# Patient Record
Sex: Male | Born: 1975 | Race: Black or African American | Hispanic: No | Marital: Married | Smoking: Never smoker
Health system: Southern US, Community
[De-identification: ages and names within clinical notes are randomized; demographics above are authoritative.]

## PROBLEM LIST (undated history)

## (undated) DIAGNOSIS — I10 Essential (primary) hypertension: Secondary | ICD-10-CM

---

## 2002-12-22 ENCOUNTER — Encounter: Payer: Self-pay | Admitting: Family Medicine

## 2002-12-22 ENCOUNTER — Ambulatory Visit (HOSPITAL_COMMUNITY): Admission: RE | Admit: 2002-12-22 | Discharge: 2002-12-22 | Payer: Self-pay | Admitting: Family Medicine

## 2005-10-30 ENCOUNTER — Ambulatory Visit: Payer: Self-pay | Admitting: Orthopedic Surgery

## 2006-08-21 ENCOUNTER — Emergency Department (HOSPITAL_COMMUNITY): Admission: EM | Admit: 2006-08-21 | Discharge: 2006-08-22 | Payer: Self-pay | Admitting: Emergency Medicine

## 2007-10-23 IMAGING — CR DG SHOULDER 2+V PORT*R*
1 series · 1 of 1 positions shown · non-contrast
Comparison: 08/21/06.

CLINICAL DATA: Shoulder dislocation. Status-post reduction. 
 RIGHT SHOULDER ? 1 VIEW:

[view not recorded]
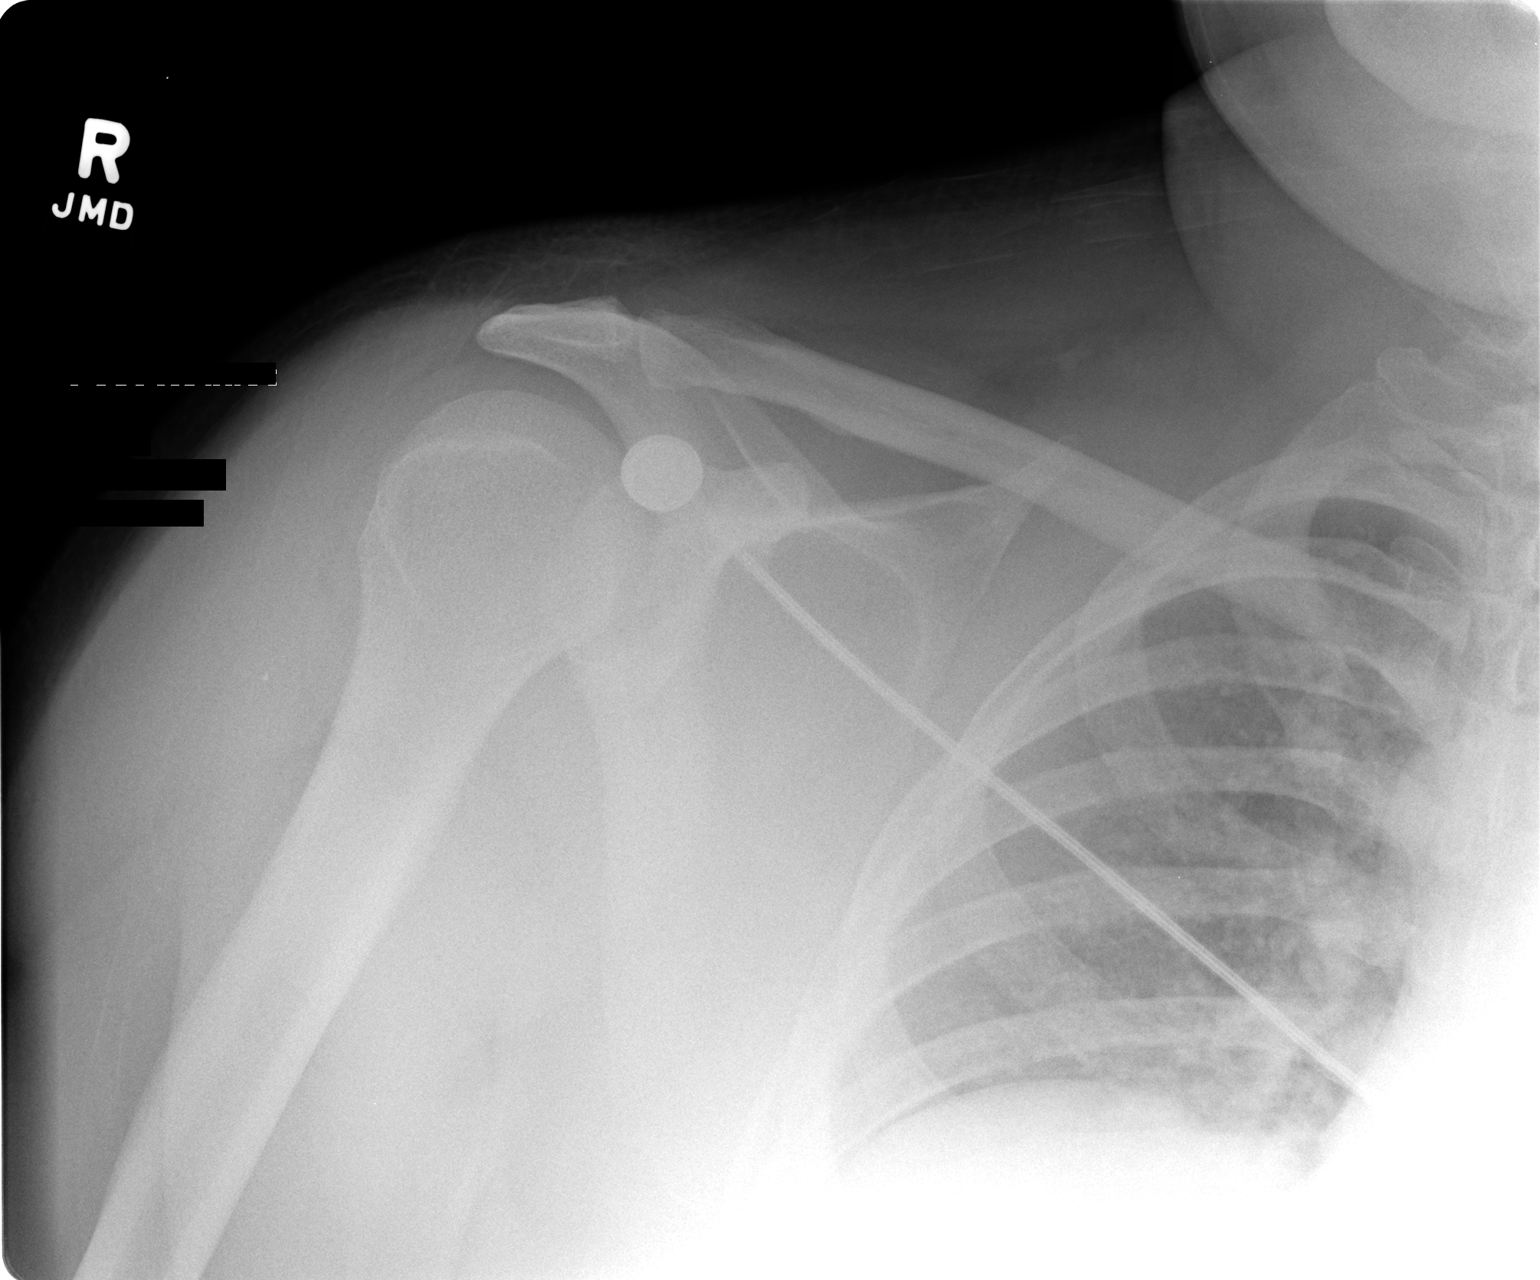

[1 of 1 positions shown; findings below may reference images not displayed]

FINDINGS: The right glenohumeral joint now shows normal alignment.  An obvious fracture is not visualized after reduction.
IMPRESSION: No visible fracture after reduction of the shoulder dislocation.

## 2020-08-08 ENCOUNTER — Ambulatory Visit: Payer: Self-pay

## 2020-08-27 ENCOUNTER — Ambulatory Visit: Payer: Self-pay | Attending: Family

## 2020-08-27 DIAGNOSIS — Z23 Encounter for immunization: Secondary | ICD-10-CM

## 2020-12-25 NOTE — Progress Notes (Signed)
   Covid-19 Vaccination Clinic  Name:  Charles Pearson    MRN: [id number] DOB: Jun 05, 1976  12/25/2020  [name] was observed post Covid-19 immunization for 15 minutes without incident. He was provided with Vaccine Information Sheet and instruction to access the V-Safe system.   [name] was instructed to call 911 with any severe reactions post vaccine: Marland Kitchen Difficulty breathing  . Swelling of face and throat  . A fast heartbeat  . A bad rash all over body  . Dizziness and weakness   Immunizations Administered    Name Date Dose VIS Date Route   Pfizer COVID-19 Vaccine 08/27/2020  9:00 AM 0.3 mL 07/11/2020 Intramuscular   Manufacturer: [employer], [employer]   Lot: [id number]   NDC: [address]-4

## 2021-06-05 ENCOUNTER — Emergency Department (HOSPITAL_BASED_OUTPATIENT_CLINIC_OR_DEPARTMENT_OTHER)
Admission: EM | Admit: 2021-06-05 | Discharge: 2021-06-05 | Disposition: A | Discharge status: Home / Self Care | Payer: No Typology Code available for payment source | Attending: Emergency Medicine | Admitting: Emergency Medicine

## 2021-06-05 ENCOUNTER — Other Ambulatory Visit: Payer: Self-pay

## 2021-06-05 ENCOUNTER — Encounter (HOSPITAL_BASED_OUTPATIENT_CLINIC_OR_DEPARTMENT_OTHER): Payer: Self-pay

## 2021-06-05 DIAGNOSIS — M545 Low back pain, unspecified: Secondary | ICD-10-CM | POA: Diagnosis not present

## 2021-06-05 DIAGNOSIS — X509XXA Other and unspecified overexertion or strenuous movements or postures, initial encounter: Secondary | ICD-10-CM | POA: Insufficient documentation

## 2021-06-05 DIAGNOSIS — I10 Essential (primary) hypertension: Secondary | ICD-10-CM | POA: Insufficient documentation

## 2021-06-05 DIAGNOSIS — Y990 Civilian activity done for income or pay: Secondary | ICD-10-CM | POA: Diagnosis not present

## 2021-06-05 DIAGNOSIS — Y9289 Other specified places as the place of occurrence of the external cause: Secondary | ICD-10-CM | POA: Insufficient documentation

## 2021-06-05 HISTORY — DX: Essential (primary) hypertension: I10

## 2021-06-05 MED ORDER — NAPROXEN 250 MG PO TABS
500.0000 mg | ORAL_TABLET | Freq: Once | ORAL | Status: AC
  Administered 2021-06-05: 500 mg via ORAL
  Filled 2021-06-05: qty 2

## 2021-06-05 MED ORDER — NAPROXEN 500 MG PO TABS: 500.0000 mg | ORAL_TABLET | Freq: Two times a day (BID) | ORAL | 0 refills | Status: AC

## 2021-06-05 MED ORDER — METHOCARBAMOL 500 MG PO TABS: 500.0000 mg | ORAL_TABLET | Freq: Three times a day (TID) | ORAL | 0 refills | Status: AC | PRN

## 2021-06-05 NOTE — ED Triage Notes (Signed)
Patient here POV from Home with Back Pain.  Patient states he was at work yesterday and was lifting an object when he turned incorrectly and hurt his back. Pain is Lower Left Back and is Sharp.  Ambulatory. NAD Noted during Triage. GCS 15. A&Ox4.

## 2021-06-05 NOTE — ED Provider Notes (Signed)
[facility] Emergency Department Provider Note MRN:  [id number]  Arrival date & time: 06/05/21     Chief Complaint   Back Pain   History of Present Illness   Charles Pearson is a 45 y.o. year-old male with a history of hypertension presenting to the ED with chief complaint of back pain.  Location: Left lower back Duration: 1 or 2 days Onset: Sudden after lifting and twisting something at work Timing: Constant pain Description: Sharp Severity: Moderate Exacerbating/Alleviating Factors: Worse with certain motions, gets stiff when laying still for a while Associated Symptoms: None Pertinent Negatives: Denies numbness or weakness to the arms or legs, no bowel or bladder dysfunction, no abdominal pain, no fever  Additional History: None  Review of Systems  A complete 10 system review of systems was obtained and all systems are negative except as noted in the HPI and PMH.   Patient's Health History    Past Medical History:  Diagnosis Date   Hypertension     History reviewed. No pertinent surgical history.  No family history on file.  Social History   Socioeconomic History   Marital status: Married    Spouse name: Not on file   Number of children: Not on file   Years of education: Not on file   Highest education level: Not on file  Occupational History   Not on file  Tobacco Use   Smoking status: Never   Smokeless tobacco: Never  Substance and Sexual Activity   Alcohol use: Yes    Comment: Shot/Day   Drug use: Never   Sexual activity: Not on file  Other Topics Concern   Not on file  Social History Narrative   Not on file   Social Determinants of Health   Financial Resource Strain: Not on file  Food Insecurity: Not on file  Transportation Needs: Not on file  Physical Activity: Not on file  Stress: Not on file  Social Connections: Not on file  Intimate Partner Violence: Not on file     Physical Exam   Vitals:   06/05/21 0422  06/05/21 0425  BP:  (!) 141/103  Pulse: 86   Resp: 16   Temp: 98.1 F (36.7 C)   SpO2: 100%     CONSTITUTIONAL: Well-appearing, NAD NEURO:  Alert and oriented x 3, no focal deficits EYES:  eyes equal and reactive ENT/NECK:  no LAD, no JVD CARDIO: Regular rate, well-perfused, normal S1 and S2 PULM:  CTAB no wheezing or rhonchi GI/GU:  normal bowel sounds, non-distended, non-tender MSK/SPINE:  No gross deformities, no edema, tenderness to the musculature of the left lower back SKIN:  no rash, atraumatic PSYCH:  Appropriate speech and behavior  *Additional and/or pertinent findings included in MDM below  Diagnostic and Interventional Summary    EKG Interpretation  Date/Time:    Ventricular Rate:    PR Interval:    QRS Duration:   QT Interval:    QTC Calculation:   R Axis:     Text Interpretation:         Labs Reviewed - No data to display  No orders to display    Medications  naproxen (NAPROSYN) tablet 500 mg (has no administration in time range)     Procedures  /  Critical Care Procedures  ED Course and Medical Decision Making  I have reviewed the triage vital signs, the nursing notes, and pertinent available records from the EMR.  Listed above are laboratory and imaging tests that  I personally ordered, reviewed, and interpreted and then considered in my medical decision making (see below for details).  Consistent with muscular strain or sprain, herniated disc also possible, no significant radiation, no red flag symptoms to suggest myelopathy.  No hematuria or dysuria, doubt genitourinary etiology.  No abdominal tenderness.  Appropriate for discharge with symptomatic management.  No indication for imaging given the lack of trauma.       Elmer Sow. Pilar Plate, MD [facility] Emergency Medicine [facility] [email or web address]  Final Clinical Impressions(s) / ED Diagnoses     ICD-10-CM   1. Acute left-sided low back pain without sciatica  M54.50        ED Discharge Orders          Ordered    methocarbamol (ROBAXIN) 500 MG tablet  Every 8 hours PRN        06/05/21 0443    naproxen (NAPROSYN) 500 MG tablet  2 times daily        06/05/21 0443             Discharge Instructions Discussed with and Provided to Patient:    Discharge Instructions      You were evaluated in the Emergency Department and after careful evaluation, we did not find any emergent condition requiring admission or further testing in the hospital.  Your exam/testing today is overall reassuring.  Symptoms likely due to muscle strain or spasm.  Use the Naprosyn medication for pain as directed.  For more significant pain, can use the Robaxin muscle relaxer.  Please return to the Emergency Department if you experience any worsening of your condition.   Thank you for allowing [place] to be a part of your care.       Sabas Sous, MD 06/05/21 [phone]

## 2021-06-05 NOTE — Discharge Instructions (Signed)
You were evaluated in the Emergency Department and after careful evaluation, we did not find any emergent condition requiring admission or further testing in the hospital.  Your exam/testing today is overall reassuring.  Symptoms likely due to muscle strain or spasm.  Use the Naprosyn medication for pain as directed.  For more significant pain, can use the Robaxin muscle relaxer.  Please return to the Emergency Department if you experience any worsening of your condition.   Thank you for allowing [place] to be a part of your care.

## 2021-09-30 ENCOUNTER — Ambulatory Visit: Payer: BC Managed Care – PPO | Attending: Family

## 2021-09-30 DIAGNOSIS — Z23 Encounter for immunization: Secondary | ICD-10-CM

## 2021-09-30 NOTE — Progress Notes (Signed)
° °  Covid-19 Vaccination Clinic  Name:  Charles Pearson    MRN: [id number] DOB: [birth year]  09/30/2021  [name] was observed post Covid-19 immunization for 15 minutes without incident. He was provided with Vaccine Information Sheet and instruction to access the V-Safe system.   [name] was instructed to call 911 with any severe reactions post vaccine: Difficulty breathing  Swelling of face and throat  A fast heartbeat  A bad rash all over body  Dizziness and weakness   Immunizations Administered     Name Date Dose VIS Date Route   Pfizer Covid-19 Vaccine Bivalent Booster 09/30/2021 11:30 AM 0.3 mL 05/22/2021 Intramuscular   Manufacturer: [employer]   Lot: [place]   [place]: [phone]

## 2021-11-04 DIAGNOSIS — I10 Essential (primary) hypertension: Secondary | ICD-10-CM | POA: Diagnosis not present

## 2021-11-04 DIAGNOSIS — R059 Cough, unspecified: Secondary | ICD-10-CM | POA: Diagnosis not present

## 2021-11-04 DIAGNOSIS — R7303 Prediabetes: Secondary | ICD-10-CM | POA: Diagnosis not present

## 2022-07-11 DIAGNOSIS — Z13220 Encounter for screening for lipoid disorders: Secondary | ICD-10-CM | POA: Diagnosis not present

## 2022-07-11 DIAGNOSIS — R7303 Prediabetes: Secondary | ICD-10-CM | POA: Diagnosis not present

## 2022-07-11 DIAGNOSIS — I10 Essential (primary) hypertension: Secondary | ICD-10-CM | POA: Diagnosis not present

## 2023-04-08 DIAGNOSIS — M25461 Effusion, right knee: Secondary | ICD-10-CM | POA: Diagnosis not present

## 2023-04-08 DIAGNOSIS — M2241 Chondromalacia patellae, right knee: Secondary | ICD-10-CM | POA: Diagnosis not present

## 2023-04-15 DIAGNOSIS — M1711 Unilateral primary osteoarthritis, right knee: Secondary | ICD-10-CM | POA: Diagnosis not present

## 2023-05-08 ENCOUNTER — Other Ambulatory Visit: Payer: BC Managed Care – PPO | Admitting: Hematology and Oncology

## 2023-05-08 DIAGNOSIS — Z1283 Encounter for screening for malignant neoplasm of skin: Secondary | ICD-10-CM

## 2023-05-08 NOTE — Progress Notes (Signed)
Type of Exam: Above Waist   Presumptive diagnosis: no significant findings Biopsy recommended? No Referred? No

## 2023-06-24 DIAGNOSIS — R6882 Decreased libido: Secondary | ICD-10-CM | POA: Diagnosis not present

## 2023-06-24 DIAGNOSIS — R7303 Prediabetes: Secondary | ICD-10-CM | POA: Diagnosis not present

## 2023-06-24 DIAGNOSIS — I10 Essential (primary) hypertension: Secondary | ICD-10-CM | POA: Diagnosis not present

## 2023-07-10 DIAGNOSIS — Z125 Encounter for screening for malignant neoplasm of prostate: Secondary | ICD-10-CM | POA: Diagnosis not present

## 2023-07-10 DIAGNOSIS — I10 Essential (primary) hypertension: Secondary | ICD-10-CM | POA: Diagnosis not present

## 2023-07-10 DIAGNOSIS — R6882 Decreased libido: Secondary | ICD-10-CM | POA: Diagnosis not present

## 2023-07-17 DIAGNOSIS — Z0000 Encounter for general adult medical examination without abnormal findings: Secondary | ICD-10-CM | POA: Diagnosis not present

## 2023-08-05 DIAGNOSIS — Z1211 Encounter for screening for malignant neoplasm of colon: Secondary | ICD-10-CM | POA: Diagnosis not present

## 2023-08-05 DIAGNOSIS — K5730 Diverticulosis of large intestine without perforation or abscess without bleeding: Secondary | ICD-10-CM | POA: Diagnosis not present
